# Patient Record
Sex: Female | Born: 1995 | Hispanic: Yes | Marital: Single
Health system: Southern US, Community
[De-identification: ages and names within clinical notes are randomized; demographics above are authoritative.]

---

## 2020-05-20 ENCOUNTER — Emergency Department (HOSPITAL_COMMUNITY): Payer: Self-pay

## 2020-05-20 ENCOUNTER — Encounter (HOSPITAL_COMMUNITY): Payer: Self-pay | Admitting: Emergency Medicine

## 2020-05-20 ENCOUNTER — Other Ambulatory Visit: Payer: Self-pay

## 2020-05-20 ENCOUNTER — Emergency Department (HOSPITAL_COMMUNITY)
Admission: EM | Admit: 2020-05-20 | Discharge: 2020-05-20 | Disposition: A | Discharge status: Home / Self Care | Payer: Self-pay | Attending: Emergency Medicine | Admitting: Emergency Medicine

## 2020-05-20 DIAGNOSIS — R569 Unspecified convulsions: Secondary | ICD-10-CM | POA: Insufficient documentation

## 2020-05-20 DIAGNOSIS — R519 Headache, unspecified: Secondary | ICD-10-CM | POA: Insufficient documentation

## 2020-05-20 LAB — BASIC METABOLIC PANEL
Anion gap: 5 (ref 5–15)
BUN: 16 mg/dL (ref 6–20)
CO2: 26 mmol/L (ref 22–32)
Calcium: 9.4 mg/dL (ref 8.9–10.3)
Chloride: 109 mmol/L (ref 98–111)
Creatinine, Ser: 0.77 mg/dL (ref 0.44–1.00)
GFR, Estimated: >60 mL/min (ref >60)
Glucose, Bld: 128 mg/dL — ABNORMAL HIGH (ref 70–99)
Potassium: 3.8 mmol/L (ref 3.5–5.1)
Sodium: 140 mmol/L (ref 135–145)

## 2020-05-20 LAB — I-STAT BETA HCG BLOOD, ED (MC, WL, AP ONLY): I-stat hCG, quantitative: <5 m[IU]/mL (ref <5)

## 2020-05-20 LAB — CBC WITH DIFFERENTIAL/PLATELET
Abs Immature Granulocytes: 0.03 10*3/uL (ref 0.00–0.07)
Basophils Absolute: 0.1 10*3/uL (ref 0.0–0.1)
Basophils Relative: 1 %
Eosinophils Absolute: 0.1 10*3/uL (ref 0.0–0.5)
Eosinophils Relative: 1 %
HCT: 37.3 % (ref 36.0–46.0)
Hemoglobin: 12.5 g/dL (ref 12.0–15.0)
Immature Granulocytes: 0 %
Lymphocytes Relative: 10 %
Lymphs Abs: 1.1 10*3/uL (ref 0.7–4.0)
MCH: 30.5 pg (ref 26.0–34.0)
MCHC: 33.5 g/dL (ref 30.0–36.0)
MCV: 91 fL (ref 80.0–100.0)
Monocytes Absolute: 0.6 10*3/uL (ref 0.1–1.0)
Monocytes Relative: 5 %
Neutro Abs: 9.6 10*3/uL — ABNORMAL HIGH (ref 1.7–7.7)
Neutrophils Relative %: 83 %
Platelets: 201 10*3/uL (ref 150–400)
RBC: 4.1 MIL/uL (ref 3.87–5.11)
RDW: 12.9 % (ref 11.5–15.5)
WBC: 11.4 10*3/uL — ABNORMAL HIGH (ref 4.0–10.5)
nRBC: 0 % (ref 0.0–0.2)

## 2020-05-20 LAB — ETHANOL: Alcohol, Ethyl (B): <10 mg/dL (ref <10)

## 2020-05-20 LAB — RAPID URINE DRUG SCREEN, HOSP PERFORMED
Amphetamines: NOT DETECTED
Barbiturates: NOT DETECTED
Benzodiazepines: NOT DETECTED
Cocaine: NOT DETECTED
Opiates: NOT DETECTED
Tetrahydrocannabinol: NOT DETECTED

## 2020-05-20 LAB — CBG MONITORING, ED: Glucose-Capillary: 107 mg/dL — ABNORMAL HIGH (ref 70–99)

## 2020-05-20 MED ORDER — LEVETIRACETAM 500 MG PO TABS: 500.0000 mg | ORAL_TABLET | Freq: Two times a day (BID) | ORAL | 0 refills | Status: AC

## 2020-05-20 MED ORDER — SODIUM CHLORIDE 0.9 % IV BOLUS
1000.0000 mL | Freq: Once | INTRAVENOUS | Status: AC
  Administered 2020-05-20: 1000 mL via INTRAVENOUS

## 2020-05-20 MED ORDER — LEVETIRACETAM 500 MG PO TABS
500.0000 mg | ORAL_TABLET | Freq: Once | ORAL | Status: AC
  Administered 2020-05-20: 500 mg via ORAL
  Filled 2020-05-20: qty 1

## 2020-05-20 MED ORDER — METOCLOPRAMIDE HCL 5 MG/ML IJ SOLN
10.0000 mg | Freq: Once | INTRAMUSCULAR | Status: AC
  Administered 2020-05-20: 10 mg via INTRAVENOUS
  Filled 2020-05-20: qty 2

## 2020-05-20 MED ORDER — DIPHENHYDRAMINE HCL 50 MG/ML IJ SOLN
50.0000 mg | Freq: Once | INTRAMUSCULAR | Status: AC
  Administered 2020-05-20: 50 mg via INTRAVENOUS
  Filled 2020-05-20: qty 1

## 2020-05-20 NOTE — Discharge Instructions (Signed)
You came to the emergency department today to be evaluated for your headache and seizure-like activity.  Your physical exam was reassuring.  The CT scan showed no acute abnormalities.  Your lab work was reassuring.  Due to universal history of having seizures in the past I have restarted you on Keppra.  Please take 1 pill twice a day.    Please follow-up with your neurologist Dr. Clelia Croft.  Due to your seizure-like activity today you are not allowed to drive for the next 6 months.  Please read attached paperwork on seizures, Keppra medication, and headaches.  Get help right away if: You have: A seizure that does not stop after 5 minutes. Several seizures in a row without a complete recovery between seizures. A seizure that makes it harder to breathe. A seizure that leaves you unable to speak or use a part of your body. You do not wake up right away after a seizure. You injure yourself during a seizure. You have confusion or pain right after a seizure.

## 2020-05-20 NOTE — ED Provider Notes (Signed)
[facility]-EMERGENCY DEPT Provider Note   CSN: [id number] Arrival date & time: 05/20/20  1314     History Chief Complaint  Patient presents with  . seizure-like activity    [facility] is a 25 y.o. female presents with a chief complaint of sudden onset of headache and seizure-like activity.  Patient reports that approximately 1300 this afternoon while patient was playing soccer she had a sudden onset of headache.  Patient states "my head seems like it is going to explode and I lose control of my body."  Patient reports that headache is throughout her entire head.  Patient rates pain 10/10 on the pain scale.  Patient denies any alleviating or aggravating factors.  Patient denies any head injury.  Patient endorses blurry vision with onset of headache.  Patient denies any slurred speech, facial asymmetry, numbness to extremities, weakness to extremities.  Patient reports that she was able to walk off the field and laid down on the sideline.  Patient's friend reports that that had "convulsions," throughout her entire body that lasted for 15 minutes.  Patient was taken by EMS prior to resolution.  Patient friend denied any enuresis.  Per triage note patient was able to respond and talk with responders upon their arrival on scene.  Patient received 4 mg of Zofran and 500 cc of normal saline.  He reports vomiting once with EMS.  Per chart review patient had head injury with loss of consciousness and seizure playing soccer 12/2018, patient was placed on Keppra at time of injury.  Patient had MR venogram of head without contrast on 11/26/2018 which showed no evidence of sinus thrombosis or acute infarct.  Patient has previously seen [facility].  EEG was performed 12/2018 which showed normal EEG with no focal, lateralized, or epileptiform features.  Patient does not take any medication for seizure at this time.  She denies any alcohol use or illicit  drug use.  Patient denies any medication use.  Interview was conducted with Spanish interpreter.   HPI     No past medical history on file.  There are no problems to display for this patient.      OB History   No obstetric history on file.     No family history on file.     Home Medications Prior to Admission medications   Not on File    Allergies    Patient has no known allergies.  Review of Systems   Review of Systems  Constitutional: Negative for chills and fever.  Eyes: Positive for visual disturbance. Negative for photophobia.  Respiratory: Negative for shortness of breath.   Cardiovascular: Negative for chest pain.  Gastrointestinal: Negative for abdominal pain, nausea and vomiting.  Genitourinary: Negative for enuresis.  Musculoskeletal: Negative for back pain and neck pain.  Skin: Negative for color change and rash.  Neurological: Positive for tremors and headaches. Negative for dizziness, seizures, syncope, facial asymmetry, speech difficulty, weakness, light-headedness and numbness.  Psychiatric/Behavioral: Negative for confusion.    Physical Exam Updated Vital Signs BP 97/62   Pulse 76   Temp 98.5 F (36.9 C) (Oral)   Resp 18   Ht 5\' 7"  (1.702 m)   Wt 72.6 kg   SpO2 98%   BMI 25.06 kg/m   Physical Exam Vitals and nursing note reviewed.  Constitutional:      General: She is not in acute distress.    Appearance: She is not ill-appearing, toxic-appearing or diaphoretic.  HENT:     Head: Normocephalic and atraumatic. No raccoon eyes, Battle's sign, abrasion, contusion, masses, right periorbital erythema, left periorbital erythema or laceration.     Jaw: No trismus or pain on movement.     Mouth/Throat:     Mouth: Mucous membranes are moist.     Pharynx: Oropharynx is clear. Uvula midline. No pharyngeal swelling, oropharyngeal exudate, posterior oropharyngeal erythema or uvula swelling.  Eyes:     General: No scleral icterus.       Right  eye: No discharge.        Left eye: No discharge.     Extraocular Movements: Extraocular movements intact.     Pupils: Pupils are equal, round, and reactive to light.  Cardiovascular:     Rate and Rhythm: Normal rate.  Pulmonary:     Effort: Pulmonary effort is normal. No respiratory distress.     Breath sounds: No stridor.  Abdominal:     Palpations: Abdomen is soft.     Tenderness: There is no abdominal tenderness.  Musculoskeletal:     Cervical back: Normal range of motion and neck supple. No edema, erythema, signs of trauma, rigidity, torticollis or crepitus. No pain with movement, spinous process tenderness or muscular tenderness. Normal range of motion.     Right lower leg: No swelling, tenderness or bony tenderness. No edema.     Left lower leg: No tenderness or bony tenderness. No edema.     Comments: No midline tenderness, deformity, or step-off to cervical, thoracic, or lumbar spine  Skin:    General: Skin is warm and dry.  Neurological:     General: No focal deficit present.     Mental Status: She is alert.     GCS: GCS eye subscore is 4. GCS verbal subscore is 5. GCS motor subscore is 6.     Cranial Nerves: No cranial nerve deficit or facial asymmetry.     Sensory: Sensation is intact.     Motor: No weakness, tremor or seizure activity.     Coordination: Romberg sign negative. Finger-Nose-Finger Test normal.     Gait: Gait is intact. Gait normal.     Comments: CN II-XII intact, equal grip strength, +5 strength to bilateral upper and lower extremities   Patient is alert to person and time; does not know location at this time.  Psychiatric:        Behavior: Behavior is cooperative.     ED Results / Procedures / Treatments   Labs (all labs ordered are listed, but only abnormal results are displayed) Labs Reviewed  BASIC METABOLIC PANEL - Abnormal; Notable for the following components:      Result Value   Glucose, Bld 128 (*)    All other components within normal  limits  CBC WITH DIFFERENTIAL/PLATELET - Abnormal; Notable for the following components:   WBC 11.4 (*)    Neutro Abs 9.6 (*)    All other components within normal limits  CBG MONITORING, ED - Abnormal; Notable for the following components:   Glucose-Capillary 107 (*)    All other components within normal limits  RAPID URINE DRUG SCREEN, HOSP PERFORMED  ETHANOL  I-STAT BETA HCG BLOOD, ED ([facility], [facility], [facility] ONLY)    EKG EKG Interpretation  Date/Time:  Sunday May 20 2020 15:04:17 EDT Ventricular Rate:  85 PR Interval:  155 QRS Duration: 75 QT Interval:  375 QTC Calculation: 446 R Axis:   79 Text Interpretation: Sinus rhythm Consider left atrial enlargement Borderline T abnormalities,  anterior leads Confirmed by Lorre Nick ([id number]) on 05/20/2020 3:22:02 PM   Radiology CT Head Wo Contrast  Result Date: 05/20/2020 CLINICAL DATA:  Headache. Suspect intracranial hemorrhage. Seizure. Denies hitting head. EXAM: CT HEAD WITHOUT CONTRAST TECHNIQUE: Contiguous axial images were obtained from the base of the skull through the vertex without intravenous contrast. COMPARISON:  None. FINDINGS: Brain: No evidence of acute infarction, hemorrhage, hydrocephalus, extra-axial collection or mass lesion/mass effect. Vascular: No hyperdense vessel or unexpected calcification. Skull: Normal. Negative for fracture or focal lesion. Sinuses/Orbits: No acute finding. Other: None. IMPRESSION: No evidence for acute  abnormality. Electronically Signed   By: Norva Pavlov M.D.   On: 05/20/2020 15:15    Procedures Procedures   Medications Ordered in ED Medications  metoCLOPramide (REGLAN) injection 10 mg (10 mg Intravenous Given 05/20/20 1459)  diphenhydrAMINE (BENADRYL) injection 50 mg (50 mg Intravenous Given 05/20/20 1459)  sodium chloride 0.9 % bolus 1,000 mL (0 mLs Intravenous Stopped 05/20/20 1641)  levETIRAcetam (KEPPRA) tablet 500 mg (500 mg Oral Given 05/20/20 1756)    ED Course  I have reviewed the  triage vital signs and the nursing notes.  Pertinent labs & imaging results that were available during my care of the patient were reviewed by me and considered in my medical decision making (see chart for details).    MDM Rules/Calculators/A&P                          Alert 25 year old female in no acute distress, nontoxic-appearing.  Patient presents to the emergency department with chief complaint of sudden onset of severe headache while playing soccer followed by reported 15 minutes of convulsions.  Per chart review patient had head injury with loss of consciousness and seizure playing soccer 12/2018, patient was placed on Keppra at time of injury.  Patient had MR venogram of head without contrast on 11/26/2018 which showed no evidence of sinus thrombosis or acute infarct.  Patient has previously seen Dr. Sherryll Burger with [facility] neurology.  EEG was performed 12/2018 which showed normal EEG with no focal, lateralized, or epileptiform features.  Patient reports that she is not currently taking any Keppra medication.  On physical exam patient noted to be alert to person only time.  Patient has no focal neurological deficits.  No signs of enuresis or oral trauma.  Will obtain POC CBG, BMP, CBC, pregnancy test, UDS, ethanol and noncontrast CT head.  We will give patient migraine cocktail and 1 L fluid bolus.  EKG shows normal sinus rhythm. CBG 107 BMP unremarkable. CBC shows slight leukocytosis at 11.4, suspect due to acute phase reactant. Pregnancy test negative. UDS unremarkable. Ethanol negative. Noncontrast head CT shows no evidence of acute abnormality.  Will reach out to neurology.  1550 spoke to neurologist Dr. Derry Lory who recommended restarting patient on Keppra at the same dose she was previously taking, restricting driving for the next 6 months and having her follow-up with neurologist.  Per chart review patient was previously taking 500 mg Keppra twice daily.  Patient given  30-day prescription of this medication.  Will have patient follow-up with Dr. Sherryll Burger.  On serial repeat examination patient reports resolution of her headache after receiving migraine cocktail.  Patient continues to have no difficulty moving all extremities, slurred speech, or facial asymmetry.  Patient instructed on importance of driving restriction, compliance with Keppra medication and neurology follow-up.  Patient given strict return precautions.  Patient expressed understanding of all instructions and is agreeable with  this plan.  Final Clinical Impression(s) / ED Diagnoses Final diagnoses:  Acute nonintractable headache, unspecified headache type  Seizure-like activity (HCC)    Rx / DC Orders ED Discharge Orders         Ordered    levETIRAcetam (KEPPRA) 500 MG tablet  2 times daily        05/20/20 1741           Berneice Heinrich 05/21/20 0106    Lorre Nick, MD 05/21/20 [phone]

## 2020-05-20 NOTE — ED Triage Notes (Signed)
Arrives via EMS from a soccer field, had some seizure activity while she was playing soccer, but she was able to respond and talk to first responders, denies hitting head or LOC. Complains of a headache. Not any any medications. EMS gave 4 of Zofran and 500 cc of NS.   CBG 140.

## 2022-12-04 IMAGING — CT CT HEAD W/O CM
3 series · 15 of 47 positions shown, 18 images · non-contrast
Comparison: None.

CLINICAL DATA: Headache. Suspect intracranial hemorrhage. Seizure.
Denies hitting head.

EXAM:
CT HEAD WITHOUT CONTRAST
TECHNIQUE: Contiguous axial images were obtained from the base of the skull
through the vertex without intravenous contrast.

[Series 2: head wo · axial · 0.47mm/px · z∈[+1314,+1449]mm · 9 of 33 slices shown, 12 images]
[im 3/33  brain]
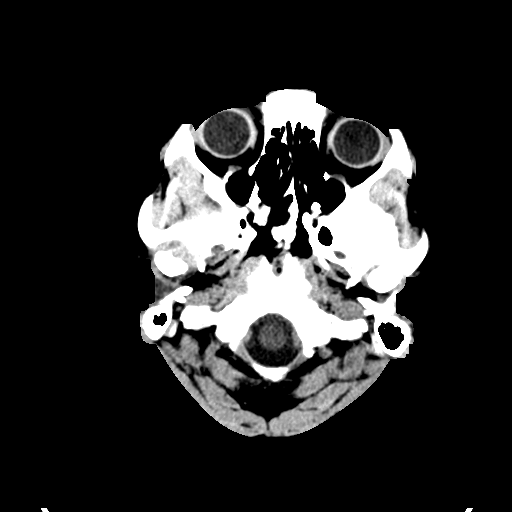
[im 3/33  bone]
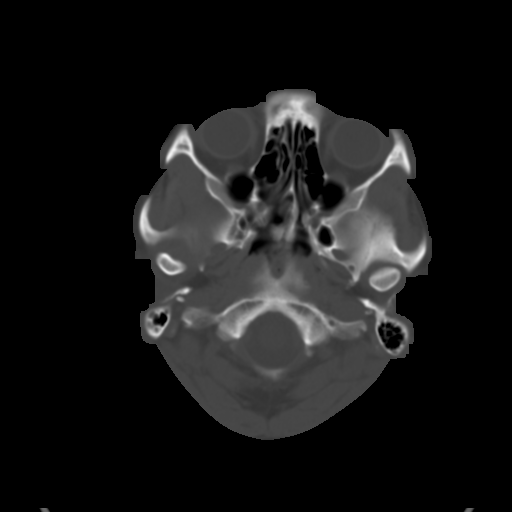
[im 6/33  brain]
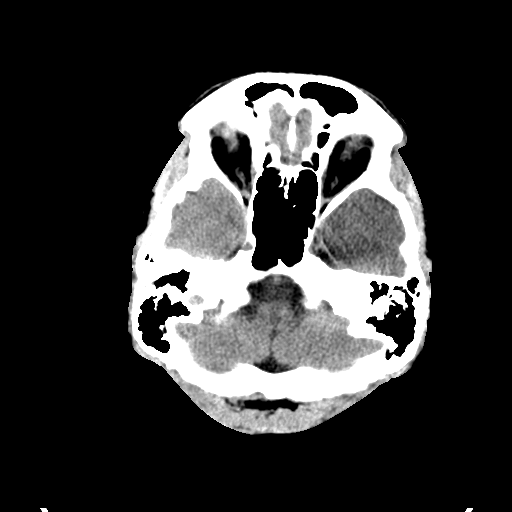
[im 9/33  brain]
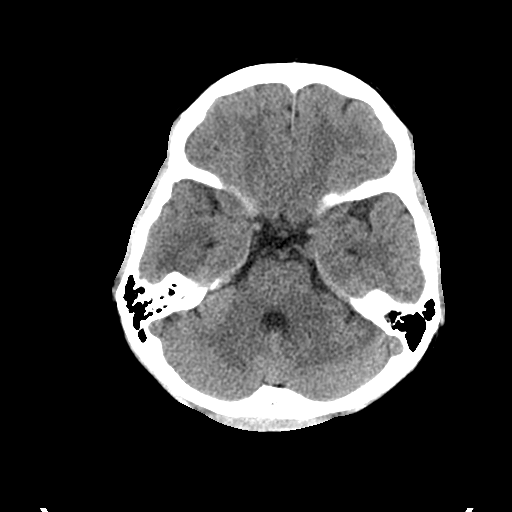
[im 13/33  brain]
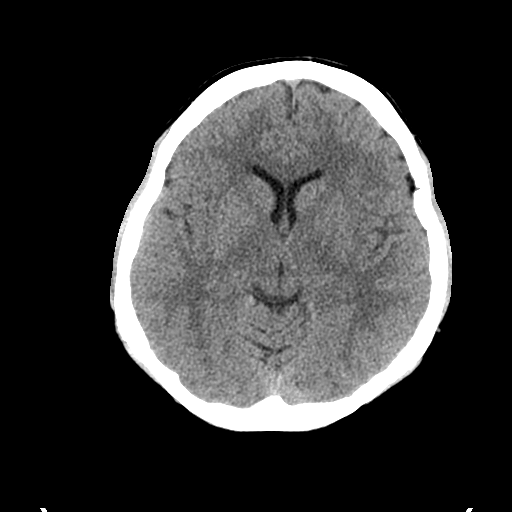
[im 17/33  brain]
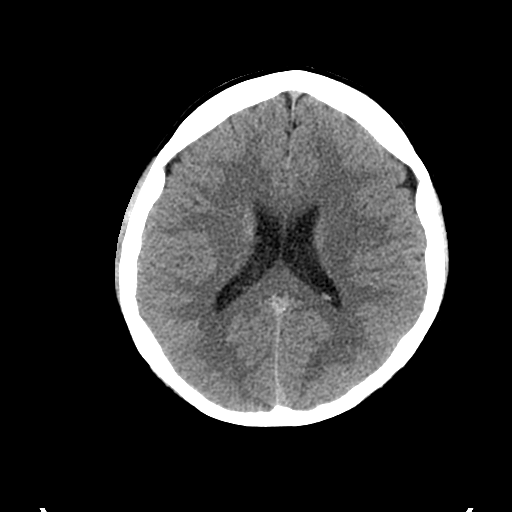
[im 17/33  bone]
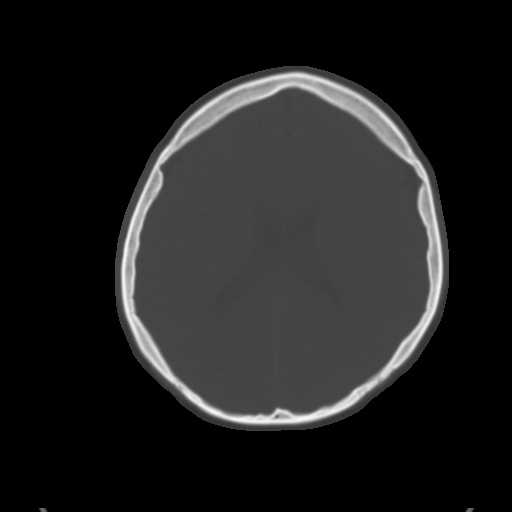
[im 20/33  brain]
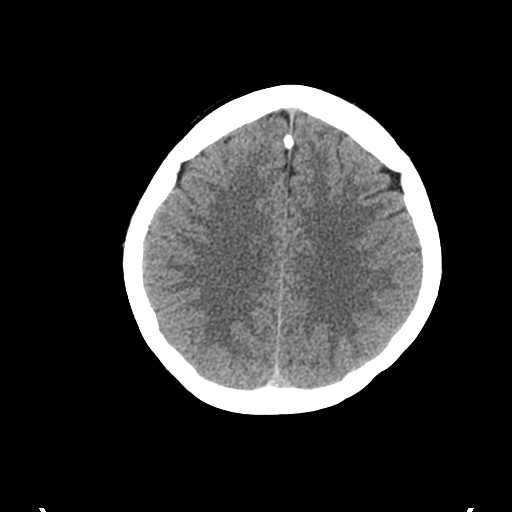
[im 24/33  brain]
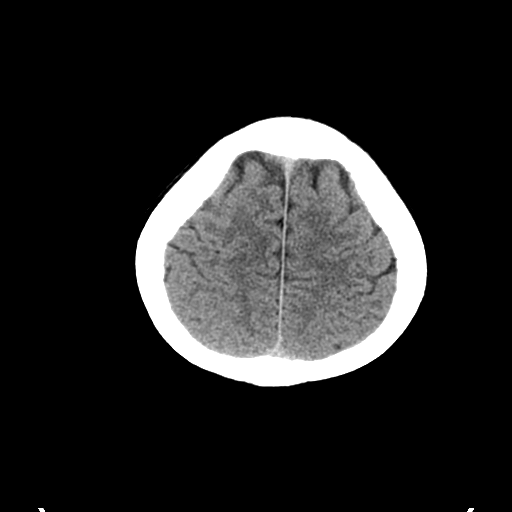
[im 27/33  brain]
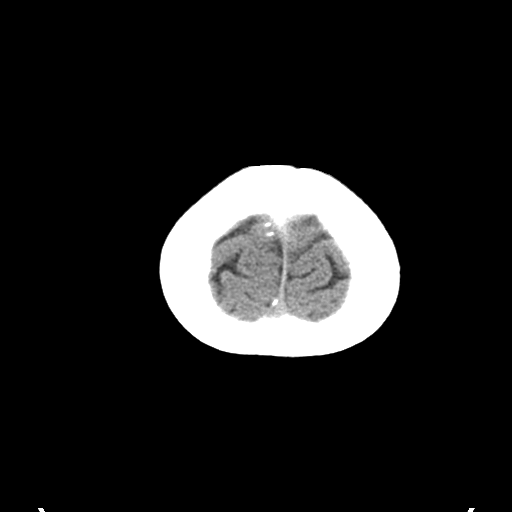
[im 30/33  brain]
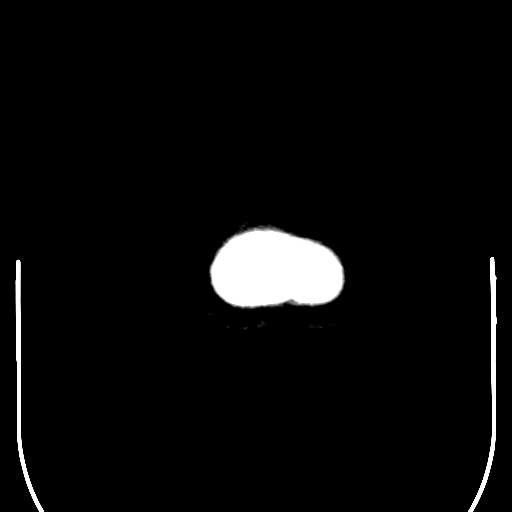
[im 30/33  bone]
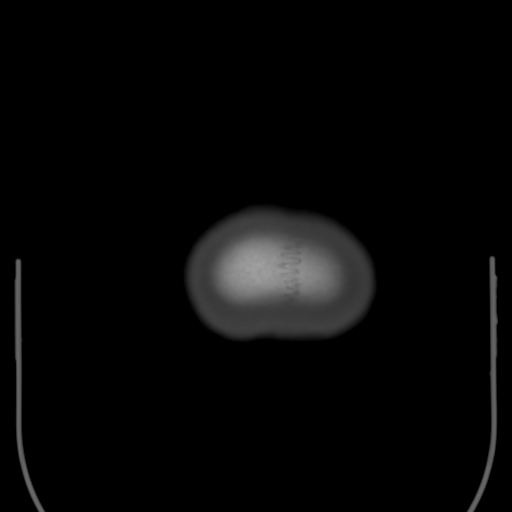

[Series 5: coronal soft tissue · coronal · 0.31mm/px · 3 of 66 slices shown]
[im 22/66  brain]
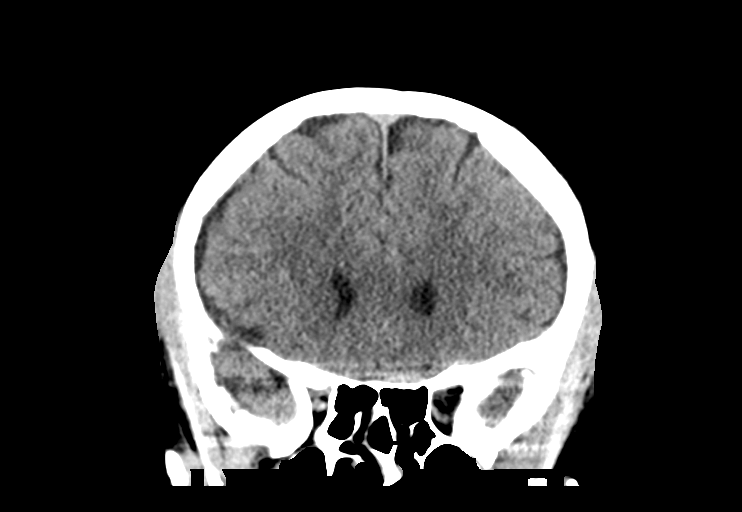
[im 29/66  brain]
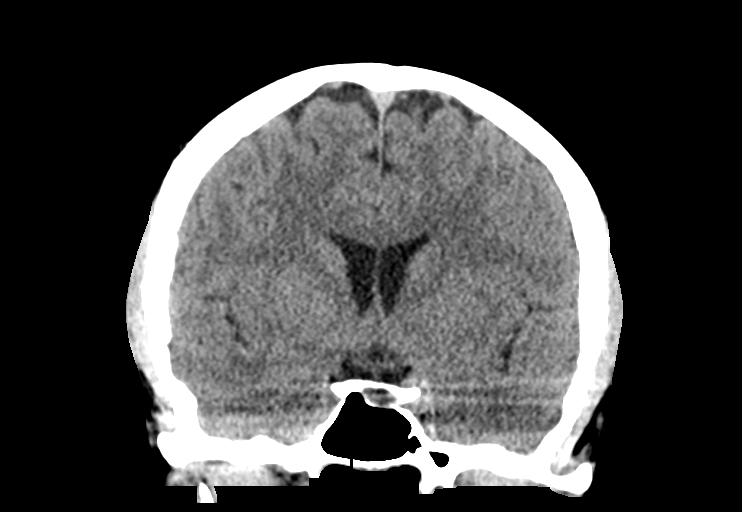
[im 37/66  brain]
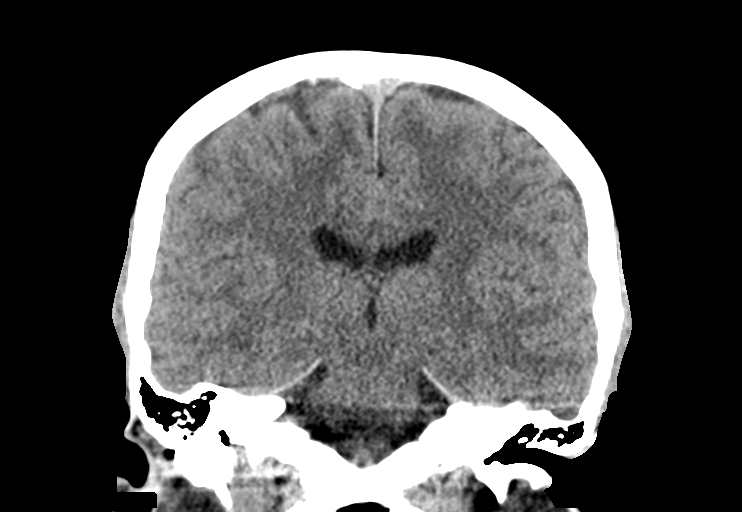

[Series 6: sagittal soft tissue · sagittal · 0.31mm/px · 3 of 60 slices shown]
[im 20/60  brain]
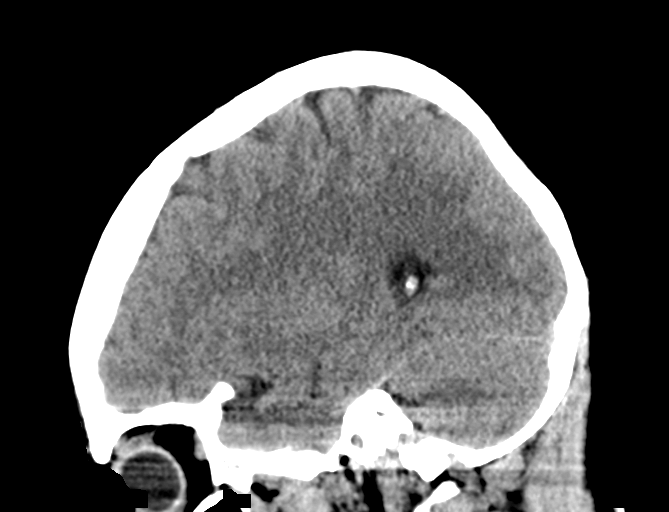
[im 30/60  brain]
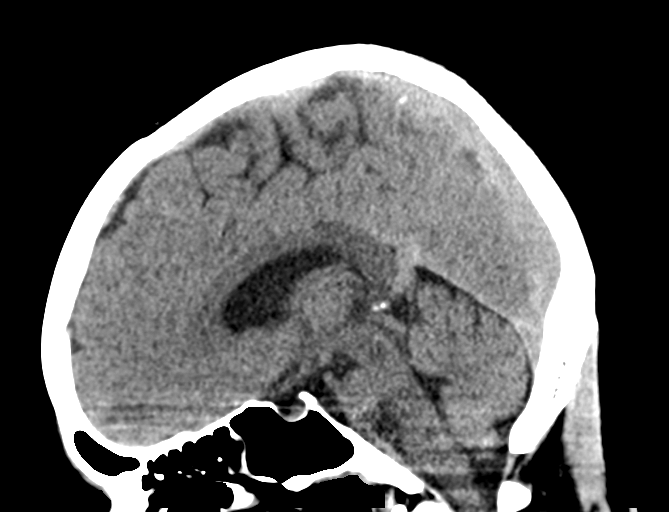
[im 40/60  brain]
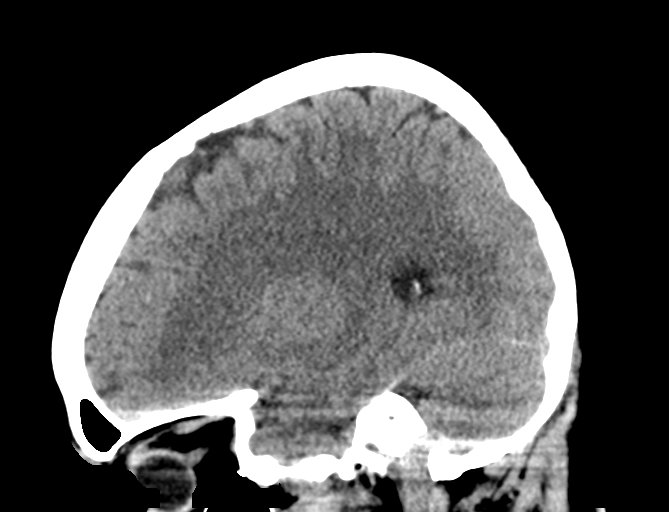

[15 of 47 positions shown; findings below may reference images not displayed]

FINDINGS: Brain: No evidence of acute infarction, hemorrhage, hydrocephalus,
extra-axial collection or mass lesion/mass effect.

Vascular: No hyperdense vessel or unexpected calcification.

Skull: Normal. Negative for fracture or focal lesion.

Sinuses/Orbits: No acute finding.

Other: None.
IMPRESSION: No evidence for acute  abnormality.
